# Patient Record
Sex: Female | Born: 1945 | Race: White | Hispanic: No | Marital: Single | Smoking: Never smoker
Health system: Southern US, Community
[De-identification: ages and names within clinical notes are randomized; demographics above are authoritative.]

---

## 2021-04-01 ENCOUNTER — Other Ambulatory Visit: Payer: Self-pay

## 2021-04-01 ENCOUNTER — Emergency Department: Payer: Medicare (Managed Care)

## 2021-04-01 ENCOUNTER — Emergency Department
Admission: EM | Admit: 2021-04-01 | Discharge: 2021-04-01 | Disposition: A | Discharge status: Home / Self Care | Payer: Medicare (Managed Care) | Attending: Emergency Medicine | Admitting: Emergency Medicine

## 2021-04-01 DIAGNOSIS — E039 Hypothyroidism, unspecified: Secondary | ICD-10-CM | POA: Diagnosis not present

## 2021-04-01 DIAGNOSIS — Z79899 Other long term (current) drug therapy: Secondary | ICD-10-CM | POA: Diagnosis not present

## 2021-04-01 DIAGNOSIS — E278 Other specified disorders of adrenal gland: Secondary | ICD-10-CM

## 2021-04-01 DIAGNOSIS — E279 Disorder of adrenal gland, unspecified: Secondary | ICD-10-CM | POA: Insufficient documentation

## 2021-04-01 DIAGNOSIS — R1031 Right lower quadrant pain: Secondary | ICD-10-CM | POA: Diagnosis present

## 2021-04-01 LAB — COMPREHENSIVE METABOLIC PANEL
ALT: 21 U/L (ref 0–44)
AST: 20 U/L (ref 15–41)
Albumin: 3.8 g/dL (ref 3.5–5.0)
Alkaline Phosphatase: 70 U/L (ref 38–126)
Anion gap: 7 (ref 5–15)
BUN: 13 mg/dL (ref 8–23)
CO2: 29 mmol/L (ref 22–32)
Calcium: 8.9 mg/dL (ref 8.9–10.3)
Chloride: 99 mmol/L (ref 98–111)
Creatinine, Ser: 0.63 mg/dL (ref 0.44–1.00)
GFR, Estimated: >60 mL/min (ref >60)
Glucose, Bld: 101 mg/dL — ABNORMAL HIGH (ref 70–99)
Potassium: 4.5 mmol/L (ref 3.5–5.1)
Sodium: 135 mmol/L (ref 135–145)
Total Bilirubin: 0.9 mg/dL (ref 0.3–1.2)
Total Protein: 6.6 g/dL (ref 6.5–8.1)

## 2021-04-01 LAB — CBC
HCT: 38.4 % (ref 36.0–46.0)
Hemoglobin: 12.7 g/dL (ref 12.0–15.0)
MCH: 29.7 pg (ref 26.0–34.0)
MCHC: 33.1 g/dL (ref 30.0–36.0)
MCV: 89.9 fL (ref 80.0–100.0)
Platelets: 268 10*3/uL (ref 150–400)
RBC: 4.27 MIL/uL (ref 3.87–5.11)
RDW: 12.4 % (ref 11.5–15.5)
WBC: 4.6 10*3/uL (ref 4.0–10.5)
nRBC: 0 % (ref 0.0–0.2)

## 2021-04-01 LAB — URINALYSIS, COMPLETE (UACMP) WITH MICROSCOPIC
Bacteria, UA: NONE SEEN
Bilirubin Urine: NEGATIVE
Glucose, UA: NEGATIVE mg/dL
Hgb urine dipstick: NEGATIVE
Ketones, ur: NEGATIVE mg/dL
Leukocytes,Ua: NEGATIVE
Nitrite: NEGATIVE
Protein, ur: NEGATIVE mg/dL
Specific Gravity, Urine: 1.005 (ref 1.005–1.030)
pH: 9 — ABNORMAL HIGH (ref 5.0–8.0)

## 2021-04-01 LAB — LACTIC ACID, PLASMA: Lactic Acid, Venous: 0.8 mmol/L (ref 0.5–1.9)

## 2021-04-01 LAB — LIPASE, BLOOD: Lipase: 33 U/L (ref 11–51)

## 2021-04-01 MED ORDER — FENTANYL CITRATE (PF) 100 MCG/2ML IJ SOLN
50.0000 ug | INTRAMUSCULAR | Status: DC | PRN
  Administered 2021-04-01: 50 ug via INTRAVENOUS
  Filled 2021-04-01: qty 2

## 2021-04-01 MED ORDER — KETOROLAC TROMETHAMINE 30 MG/ML IJ SOLN
30.0000 mg | Freq: Once | INTRAMUSCULAR | Status: AC
  Administered 2021-04-01: 30 mg via INTRAVENOUS
  Filled 2021-04-01: qty 1

## 2021-04-01 MED ORDER — SODIUM CHLORIDE 0.9 % IV BOLUS
1000.0000 mL | Freq: Once | INTRAVENOUS | Status: AC
  Administered 2021-04-01: 1000 mL via INTRAVENOUS

## 2021-04-01 MED ORDER — TRAMADOL HCL 50 MG PO TABS: 50.0000 mg | ORAL_TABLET | Freq: Four times a day (QID) | ORAL | 0 refills | Status: AC | PRN

## 2021-04-01 NOTE — ED Triage Notes (Signed)
Pt comes with c/o RLQ pain that started two days ago. Pt denies any N/V/D.  Pt states 10/10 pain.

## 2021-04-01 NOTE — ED Provider Notes (Signed)
[facility] Emergency Department Provider Note   ____________________________________________   Event Date/Time   First MD Initiated Contact with Patient 04/01/21 0902     (approximate)  I have reviewed the triage vital signs and the nursing notes.   HISTORY  Chief Complaint Abdominal Pain    HPI SHONITA RINCK is a 75 y.o. female with a stated past medical history of hypercholesterolemia and hypothyroidism who presents for right lower quadrant abdominal pain that began 2 days ago and is described as a sharp, nonradiating, 10/10 pain that is worse with deep breaths.  Patient denies any exacerbating or relieving factors other than that.  Patient states she has gallbladder and appendix as well as has a surgical history of left-sided oophorectomy after a large ovarian cyst was found.  Unclear as to whether the cyst caused a ovarian torsion.  Patient currently denies any vision changes, tinnitus, difficulty speaking, facial droop, sore throat, chest pain, shortness of breath, nausea/vomiting/diarrhea, dysuria, or weakness/numbness/paresthesias in any extremity         History reviewed. No pertinent past medical history.  There are no problems to display for this patient.   History reviewed. No pertinent surgical history.  Prior to Admission medications   Medication Sig Start Date End Date Taking? Authorizing Provider  cholecalciferol (VITAMIN D3) 25 MCG (1000 UNIT) tablet Take 1,000 Units by mouth daily.   Yes [provider]  Cranberry 1000 MG CAPS Take by mouth.   Yes [provider]  levothyroxine (SYNTHROID) 100 MCG tablet Take 100 mcg by mouth every morning. 03/22/21  Yes [provider]  Multiple Vitamins-Minerals (MULTIVITAMIN WITH MINERALS) tablet Take 1 tablet by mouth daily.   Yes [provider]  rosuvastatin (CRESTOR) 10 MG tablet Take 10 mg by mouth daily. 01/16/21  Yes [provider]  traMADol  (ULTRAM) 50 MG tablet Take 1 tablet (50 mg total) by mouth every 6 (six) hours as needed for up to 5 days for moderate pain or severe pain. 04/01/21 04/06/21 Yes Merwyn Katos, MD  zinc gluconate 50 MG tablet Take 50 mg by mouth daily.   Yes [provider]    Allergies Coconut (cocos nucifera) allergy skin test and Other  No family history on file.  Social History Social History   Tobacco Use  . Smoking status: Never Smoker  . Smokeless tobacco: Never Used  Vaping Use  . Vaping Use: Never used  Substance Use Topics  . Alcohol use: Yes    Comment: occ  . Drug use: Never    Review of Systems Constitutional: No fever/chills Eyes: No visual changes. ENT: No sore throat. Cardiovascular: Denies chest pain. Respiratory: Denies shortness of breath. Gastrointestinal: Endorses abdominal pain.  No nausea, no vomiting.  No diarrhea. Genitourinary: Negative for dysuria. Musculoskeletal: Negative for acute arthralgias Skin: Negative for rash. Neurological: Negative for headaches, weakness/numbness/paresthesias in any extremity Psychiatric: Negative for suicidal ideation/homicidal ideation   ____________________________________________   PHYSICAL EXAM:  VITAL SIGNS: ED Triage Vitals  Enc Vitals Group     BP 04/01/21 0859 (!) 167/85     Pulse Rate 04/01/21 0859 65     Resp 04/01/21 0859 19     Temp 04/01/21 0859 98 F (36.7 C)     Temp src --      SpO2 04/01/21 0859 97 %     Weight 04/01/21 0900 162 lb (73.5 kg)     Height 04/01/21 0900 5\' 6"  (1.676 m)  Head Circumference --      Peak Flow --      Pain Score 04/01/21 0857 10     Pain Loc --      Pain Edu? --      Excl. in GC? --    Constitutional: Alert and oriented. Well appearing and in no acute distress. Eyes: Conjunctivae are normal. PERRL. Head: Atraumatic. Nose: No congestion/rhinnorhea. Mouth/Throat: Mucous membranes are moist. Neck: No stridor Cardiovascular: Grossly normal heart sounds.  Good  peripheral circulation. Respiratory: Normal respiratory effort.  No retractions. Gastrointestinal: Soft and tenderness to palpation in the right lower quadrant as well as referred pain from left lower quadrant palpation. No distention. Musculoskeletal: No obvious deformities Neurologic:  Normal speech and language. No gross focal neurologic deficits are appreciated. Skin:  Skin is warm and dry. No rash noted. Psychiatric: Mood and affect are normal. Speech and behavior are normal.  ____________________________________________   LABS (all labs ordered are listed, but only abnormal results are displayed)  Labs Reviewed  COMPREHENSIVE METABOLIC PANEL - Abnormal; Notable for the following components:      Result Value   Glucose, Bld 101 (*)    All other components within normal limits  URINALYSIS, COMPLETE (UACMP) WITH MICROSCOPIC - Abnormal; Notable for the following components:   Color, Urine STRAW (*)    APPearance CLEAR (*)    pH 9.0 (*)    All other components within normal limits  LIPASE, BLOOD  CBC  LACTIC ACID, PLASMA  LACTIC ACID, PLASMA   ____________________________________________  RADIOLOGY  ED MD interpretation: CT of the abdomen and pelvis with IV contrast shows irregular right adrenal mass measuring 4 x 2 x 2 without any evidence of acute abnormalities  Official radiology report(s): CT ABDOMEN PELVIS WO CONTRAST  Result Date: 04/01/2021 CLINICAL DATA:  Right lower quadrant abdominal pain. EXAM: CT ABDOMEN AND PELVIS WITHOUT CONTRAST TECHNIQUE: Multidetector CT imaging of the abdomen and pelvis was performed following the standard protocol without IV contrast. COMPARISON:  None. FINDINGS: Lower chest: No acute abnormality. Hepatobiliary: Numerous hypoattenuated lesions throughout the liver, some measuring water density, some with intermediate density in peripheral curvilinear calcifications. The gallbladder has normal unenhanced appearance. Pancreas: Unremarkable. No  pancreatic ductal dilatation or surrounding inflammatory changes. Spleen: Normal in size without focal abnormality. Adrenals/Urinary Tract: Irregular right adrenal mass measures 3.9 by 2.1 by 2.2 cm. The left adrenal gland is normal. No evidence of hydronephrosis or nephrolithiasis. Bilateral renal cysts. Stomach/Bowel: Stomach is within normal limits. No evidence of appendicitis. The appendix is not distinctly identified. No evidence of bowel wall thickening, distention, or inflammatory changes. Scattered colonic diverticulosis without evidence of diverticulitis. Vascular/Lymphatic: Aortic atherosclerosis. No enlarged abdominal or pelvic lymph nodes. Reproductive: Uterus and bilateral adnexa are unremarkable. Other: No abdominal wall hernia or abnormality. No abdominopelvic ascites. 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 IMPRESSION: 1. Irregular right adrenal mass measures 3.9 x 2.1 x 2.2 cm. 2. Numerous indeterminate hypoattenuated lesions throughout the liver, some measuring water density, some with intermediate density and peripheral curvilinear calcifications. 3. Recommend further evaluation with contrast-enhanced MRI of the abdomen. 4. Bilateral renal cysts. 5. Scattered colonic diverticulosis without evidence of diverticulitis. 6. Aortic atherosclerosis. Aortic Atherosclerosis (ICD10-I70.0). Electronically Signed   By: Ted Mcalpine M.D.   On: 04/01/2021 11:44    ____________________________________________   PROCEDURES  Procedure(s) performed (including Critical Care):  .1-3 Lead EKG Interpretation Performed by: Merwyn Katos, MD Authorized by: Merwyn Katos, MD     Interpretation: normal  ECG rate:  60   ECG rate assessment: normal     Rhythm: sinus rhythm     Ectopy: none     Conduction: normal       ____________________________________________   INITIAL IMPRESSION / ASSESSMENT AND PLAN / ED COURSE  As part of my medical decision making, I reviewed the  following data within the electronic MEDICAL RECORD NUMBER Nursing notes reviewed and incorporated, Labs reviewed, EKG interpreted, Old chart reviewed, Radiograph reviewed and Notes from prior ED visits reviewed and incorporated        Patients symptoms not typical for emergent causes of abdominal pain such as, but not limited to, appendicitis, abdominal aortic aneurysm, surgical biliary disease, pancreatitis, SBO, mesenteric ischemia, serious intra-abdominal bacterial illness. Presentation also not typical of gynecologic emergencies such as TOA, Ovarian Torsion, PID. Not Ectopic. Doubt atypical ACS. CT scan did incidentally find a right renal mass that was communicated to the patient as well as given a copy of her CT scan on disc Pt tolerating PO. Disposition: Patient will be discharged with strict return precautions and follow up with primary MD within 12-24 hours for further evaluation. Patient understands that this still may have an early presentation of an emergent medical condition such as appendicitis that will require a recheck.      ____________________________________________   FINAL CLINICAL IMPRESSION(S) / ED DIAGNOSES  Final diagnoses:  Right lower quadrant abdominal pain  Right adrenal mass [facility])     ED Discharge Orders         Ordered    traMADol (ULTRAM) 50 MG tablet  Every 6 hours PRN        04/01/21 1300           Note:  This document was prepared using Dragon voice recognition software and may include unintentional dictation errors.   Merwyn Katos, MD 04/01/21 1504

## 2022-11-12 IMAGING — CT CT ABD-PELV W/O CM
2 of 4 series · 16 of 46 positions shown, 18 images · non-contrast
Comparison: None.

CLINICAL DATA: Right lower quadrant abdominal pain.

EXAM:
CT ABDOMEN AND PELVIS WITHOUT CONTRAST
TECHNIQUE: Multidetector CT imaging of the abdomen and pelvis was performed
following the standard protocol without IV contrast.

[Series 2: routine abd/pel wo · axial · 0.77mm/px · z∈[-998,-593]mm · 13 of 89 slices shown, 15 images]
[im 4/89  soft-tissue]
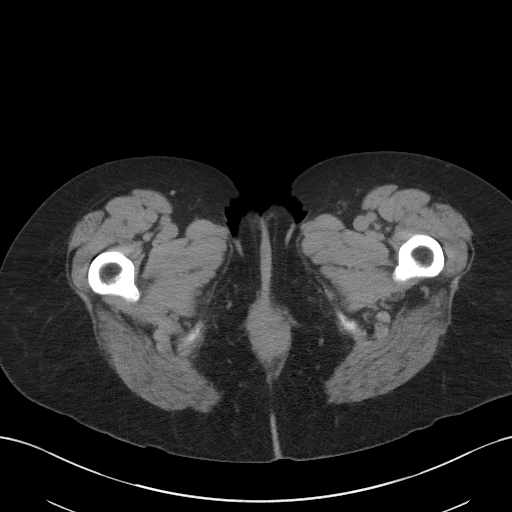
[im 4/89  bone]
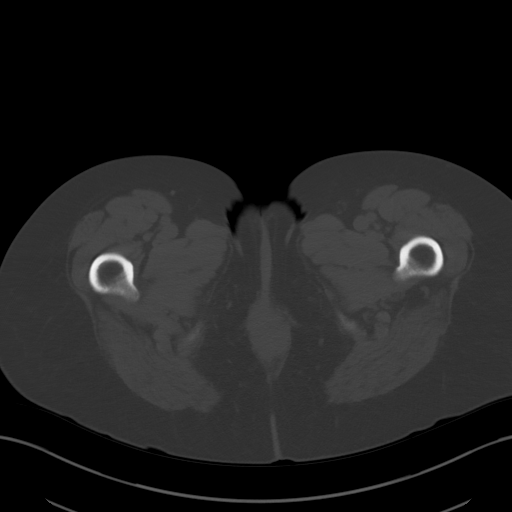
[im 11/89  soft-tissue]
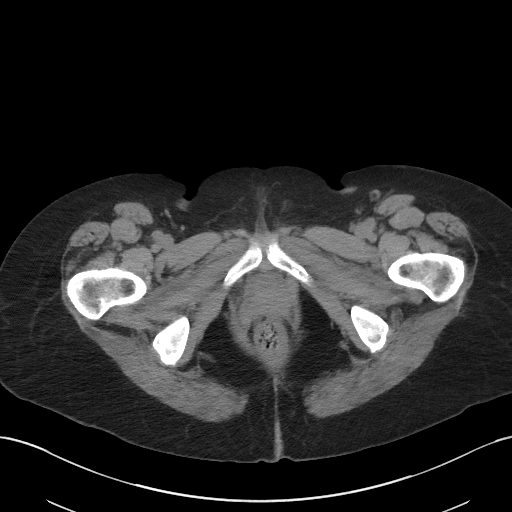
[im 18/89  soft-tissue]
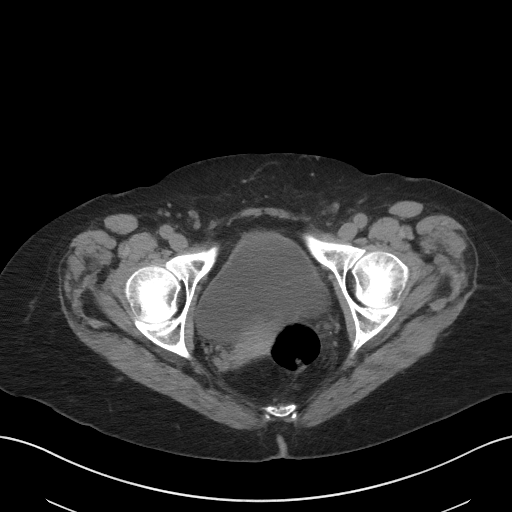
[im 25/89  soft-tissue]
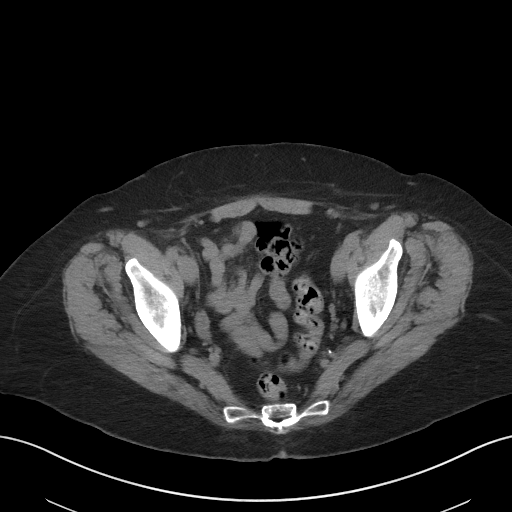
[im 32/89  soft-tissue]
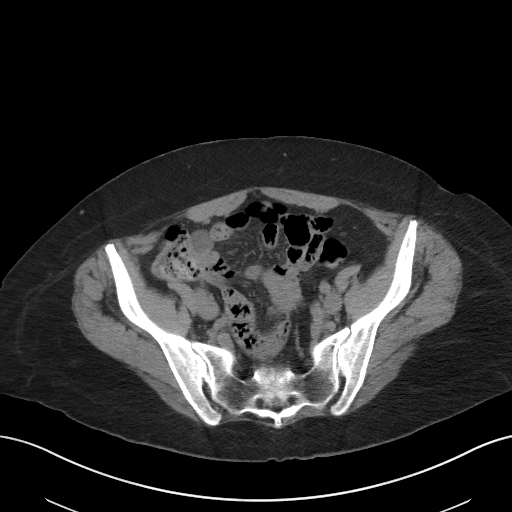
[im 39/89  soft-tissue]
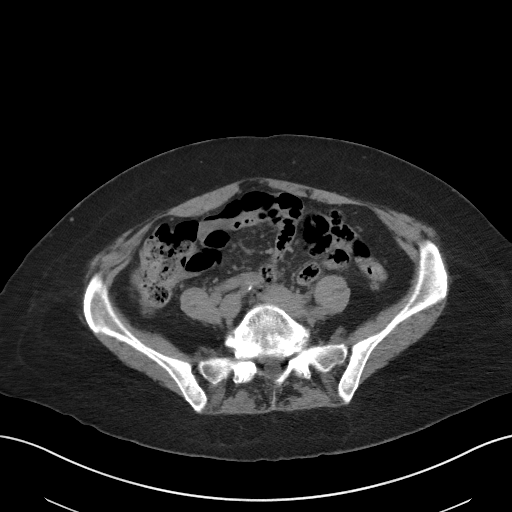
[im 46/89  soft-tissue]
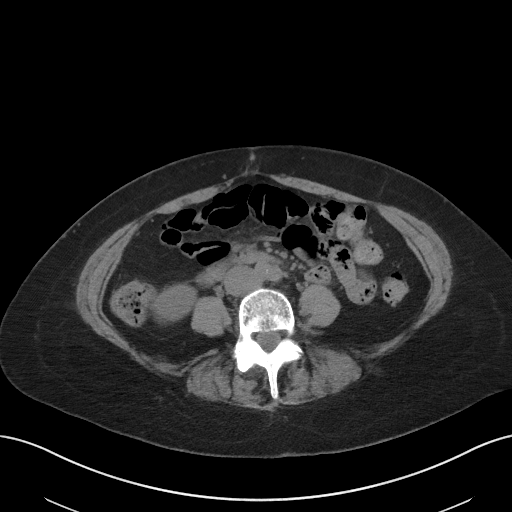
[im 50/89  soft-tissue]
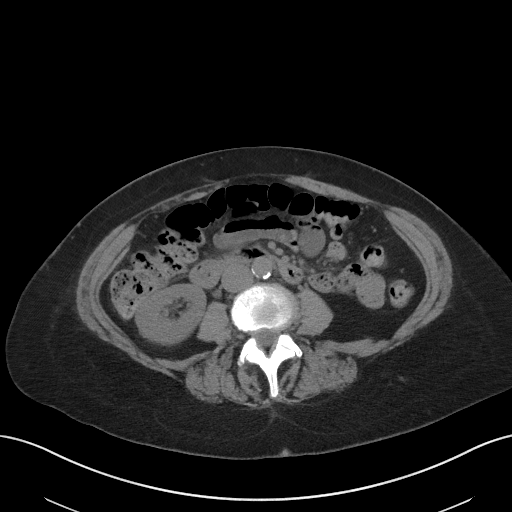
[im 57/89  soft-tissue]
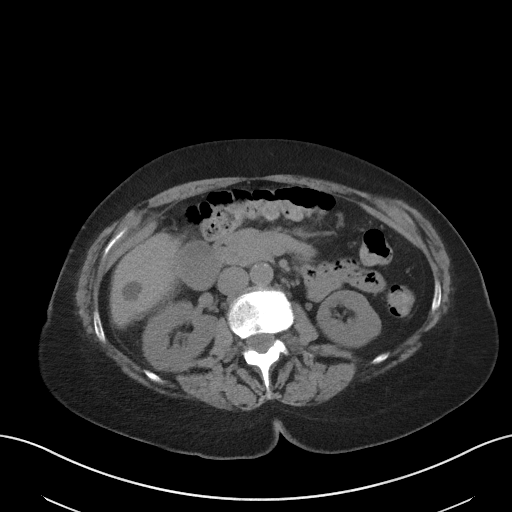
[im 57/89  bone]
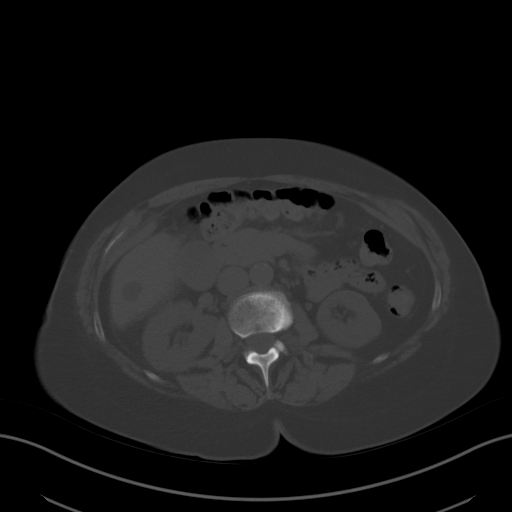
[im 64/89  soft-tissue]
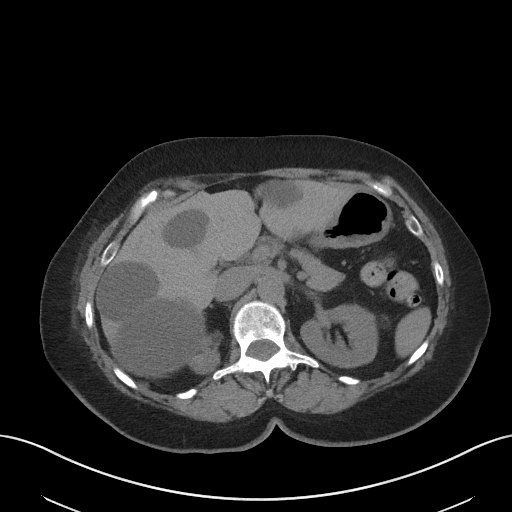
[im 71/89  soft-tissue]
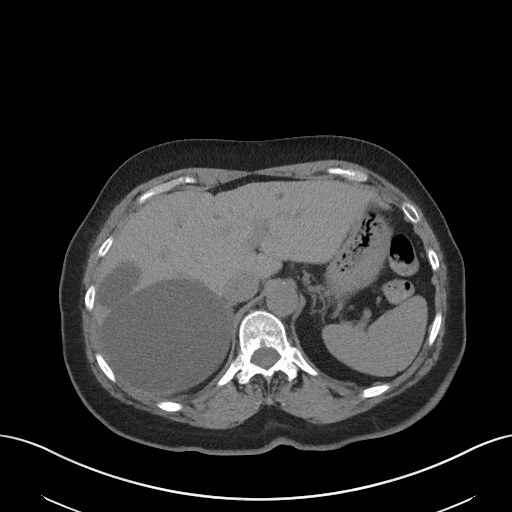
[im 78/89  soft-tissue]
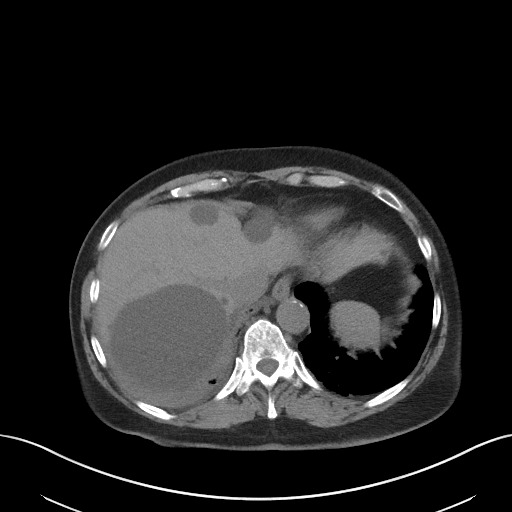
[im 85/89  soft-tissue]
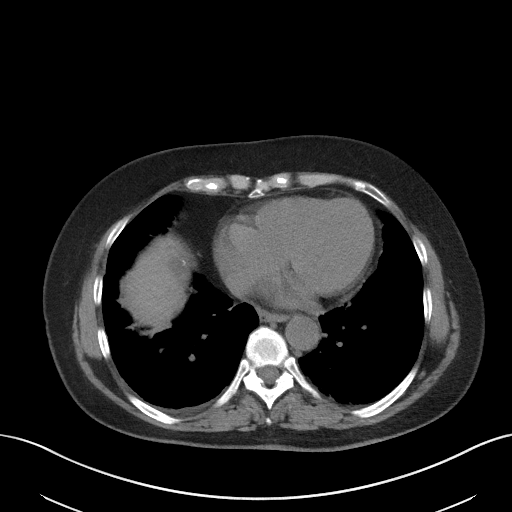

[Series 5: coronal st · coronal · 0.71mm/px · 3 of 86 slices shown]
[im 29/86  soft-tissue]
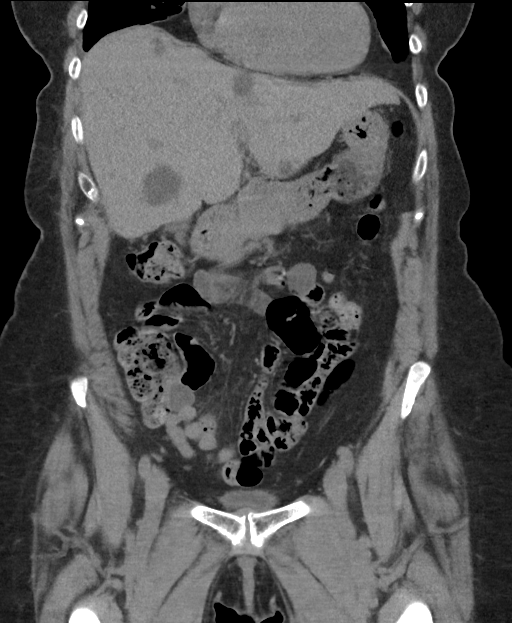
[im 38/86  soft-tissue]
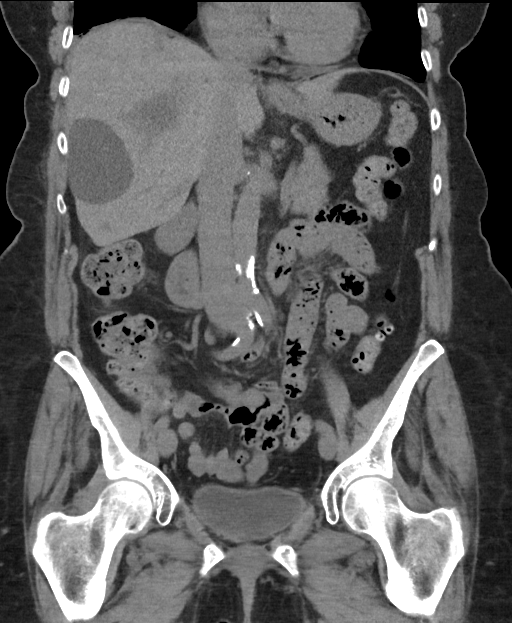
[im 48/86  soft-tissue]
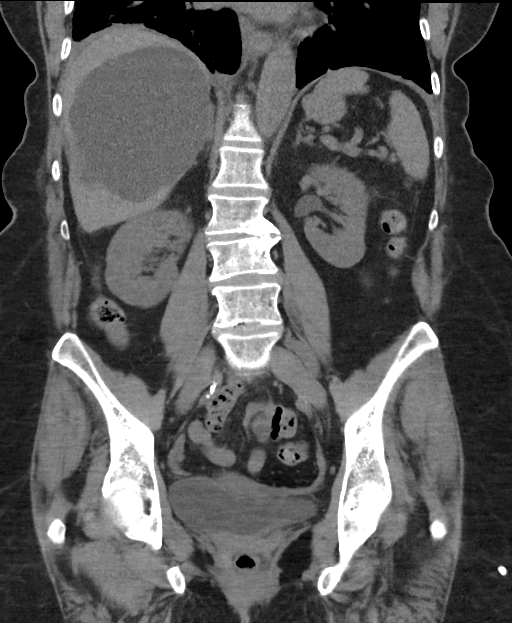

[16 of 46 positions shown; findings below may reference images not displayed]

FINDINGS: Lower chest: No acute abnormality.

Hepatobiliary: Numerous hypoattenuated lesions throughout the liver,
some measuring water density, some with intermediate density in
peripheral curvilinear calcifications. The gallbladder has normal
unenhanced appearance.

Pancreas: Unremarkable. No pancreatic ductal dilatation or
surrounding inflammatory changes.

Spleen: Normal in size without focal abnormality.

Adrenals/Urinary Tract: Irregular right adrenal mass measures 3.9 by
2.1 by 2.2 cm. The left adrenal gland is normal. No evidence of
hydronephrosis or nephrolithiasis. Bilateral renal cysts.

Stomach/Bowel: Stomach is within normal limits. No evidence of
appendicitis. The appendix is not distinctly identified. No evidence
of bowel wall thickening, distention, or inflammatory changes.
Scattered colonic diverticulosis without evidence of diverticulitis.

Vascular/Lymphatic: Aortic atherosclerosis. No enlarged abdominal or
pelvic lymph nodes.

Reproductive: Uterus and bilateral adnexa are unremarkable.

Other: No abdominal wall hernia or abnormality. No abdominopelvic
ascites.

Musculoskeletal: Osteoarthritic changes at L2-L3.
IMPRESSION: 1. Irregular right adrenal mass measures 3.9 x 2.1 x 2.2 cm.
2. Numerous indeterminate hypoattenuated lesions throughout the
liver, some measuring water density, some with intermediate density
and peripheral curvilinear calcifications.
3. Recommend further evaluation with contrast-enhanced MRI of the
abdomen.
4. Bilateral renal cysts.
5. Scattered colonic diverticulosis without evidence of
diverticulitis.
6. Aortic atherosclerosis.

Aortic Atherosclerosis ([id number]).
